# Patient Record
Sex: Female | Born: 1965 | Race: White | Hispanic: No | Marital: Married | Smoking: Never smoker
Health system: Southern US, Community
[De-identification: ages and names within clinical notes are randomized; demographics above are authoritative.]

## PROBLEM LIST (undated history)

## (undated) DIAGNOSIS — K219 Gastro-esophageal reflux disease without esophagitis: Secondary | ICD-10-CM

## (undated) DIAGNOSIS — Z800 Family history of malignant neoplasm of digestive organs: Secondary | ICD-10-CM

## (undated) DIAGNOSIS — K589 Irritable bowel syndrome without diarrhea: Secondary | ICD-10-CM

## (undated) DIAGNOSIS — Z803 Family history of malignant neoplasm of breast: Secondary | ICD-10-CM

## (undated) DIAGNOSIS — L732 Hidradenitis suppurativa: Secondary | ICD-10-CM

## (undated) DIAGNOSIS — D649 Anemia, unspecified: Secondary | ICD-10-CM

## (undated) DIAGNOSIS — J189 Pneumonia, unspecified organism: Secondary | ICD-10-CM

## (undated) DIAGNOSIS — M8700 Idiopathic aseptic necrosis of unspecified bone: Secondary | ICD-10-CM

## (undated) DIAGNOSIS — F419 Anxiety disorder, unspecified: Secondary | ICD-10-CM

## (undated) HISTORY — PX: CORE DECOMPRESSION, HIP: SHX858

## (undated) HISTORY — DX: Pneumonia, unspecified organism: J18.9

## (undated) HISTORY — DX: Family history of malignant neoplasm of digestive organs: Z80.0

## (undated) HISTORY — DX: Idiopathic aseptic necrosis of unspecified bone: M87.00

## (undated) HISTORY — DX: Anxiety disorder, unspecified: F41.9

## (undated) HISTORY — PX: OTHER SURGICAL HISTORY: SHX169

## (undated) HISTORY — PX: EYE SURGERY: SHX253

## (undated) HISTORY — DX: Gastro-esophageal reflux disease without esophagitis: K21.9

## (undated) HISTORY — DX: Family history of malignant neoplasm of breast: Z80.3

## (undated) HISTORY — DX: Irritable bowel syndrome, unspecified: K58.9

## (undated) HISTORY — DX: Hidradenitis suppurativa: L73.2

## (undated) HISTORY — DX: Anemia, unspecified: D64.9

---
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
## 2007-12-01 DIAGNOSIS — J189 Pneumonia, unspecified organism: Secondary | ICD-10-CM

## 2007-12-01 HISTORY — DX: Pneumonia, unspecified organism: J18.9

## 2008-11-02 ENCOUNTER — Ambulatory Visit: Payer: Self-pay | Admitting: Gastroenterology

## 2008-11-02 DIAGNOSIS — K591 Functional diarrhea: Secondary | ICD-10-CM

## 2008-11-02 DIAGNOSIS — K5900 Constipation, unspecified: Secondary | ICD-10-CM | POA: Insufficient documentation

## 2008-11-02 DIAGNOSIS — R1011 Right upper quadrant pain: Secondary | ICD-10-CM

## 2008-11-03 LAB — CONVERTED CEMR LAB
AST: 13 units/L (ref 0–37)
Albumin: 3.3 g/dL — ABNORMAL LOW (ref 3.5–5.2)
Alkaline Phosphatase: 54 units/L (ref 39–117)
Calcium: 8.5 mg/dL (ref 8.4–10.5)
Chloride: 103 meq/L (ref 96–112)
Eosinophils Absolute: 0.1 10*3/uL (ref 0.0–0.7)
HCT: 35.5 % — ABNORMAL LOW (ref 36.0–46.0)
Monocytes Absolute: 0.4 10*3/uL (ref 0.1–1.0)
Monocytes Relative: 6.3 % (ref 3.0–12.0)
Neutrophils Relative %: 66.2 % (ref 43.0–77.0)
Platelets: 275 10*3/uL (ref 150–400)
Potassium: 3.5 meq/L (ref 3.5–5.1)
RDW: 11.7 % (ref 11.5–14.6)
Sodium: 137 meq/L (ref 135–145)
Total Protein: 6.8 g/dL (ref 6.0–8.3)
WBC: 5.9 10*3/uL (ref 4.5–10.5)

## 2008-11-11 ENCOUNTER — Encounter (INDEPENDENT_AMBULATORY_CARE_PROVIDER_SITE_OTHER): Payer: Self-pay

## 2008-11-13 ENCOUNTER — Ambulatory Visit (HOSPITAL_COMMUNITY): Admission: RE | Admit: 2008-11-13 | Discharge: 2008-11-13 | Payer: Self-pay | Admitting: Gastroenterology

## 2008-11-13 DIAGNOSIS — K8020 Calculus of gallbladder without cholecystitis without obstruction: Secondary | ICD-10-CM | POA: Insufficient documentation

## 2008-11-20 ENCOUNTER — Ambulatory Visit: Payer: Self-pay | Admitting: Gastroenterology

## 2008-11-20 LAB — CONVERTED CEMR LAB
OCCULT 1: NEGATIVE
OCCULT 2: NEGATIVE
OCCULT 3: NEGATIVE
OCCULT 4: NEGATIVE
OCCULT 5: NEGATIVE

## 2008-11-25 ENCOUNTER — Encounter: Payer: Self-pay | Admitting: Gastroenterology

## 2008-11-27 ENCOUNTER — Ambulatory Visit: Payer: Self-pay | Admitting: Gastroenterology

## 2008-11-30 HISTORY — PX: CHOLECYSTECTOMY: SHX55

## 2008-12-16 ENCOUNTER — Ambulatory Visit (HOSPITAL_COMMUNITY): Admission: RE | Admit: 2008-12-16 | Discharge: 2008-12-17 | Payer: Self-pay | Admitting: General Surgery

## 2008-12-16 ENCOUNTER — Encounter (INDEPENDENT_AMBULATORY_CARE_PROVIDER_SITE_OTHER): Payer: Self-pay | Admitting: General Surgery

## 2009-01-13 ENCOUNTER — Encounter: Payer: Self-pay | Admitting: Gastroenterology

## 2009-01-25 ENCOUNTER — Ambulatory Visit: Payer: Self-pay | Admitting: Gastroenterology

## 2009-05-28 ENCOUNTER — Telehealth: Payer: Self-pay | Admitting: Gastroenterology

## 2009-06-02 ENCOUNTER — Ambulatory Visit: Payer: Self-pay | Admitting: Gastroenterology

## 2009-06-02 DIAGNOSIS — K219 Gastro-esophageal reflux disease without esophagitis: Secondary | ICD-10-CM

## 2009-06-02 DIAGNOSIS — K589 Irritable bowel syndrome without diarrhea: Secondary | ICD-10-CM

## 2010-05-30 ENCOUNTER — Encounter (INDEPENDENT_AMBULATORY_CARE_PROVIDER_SITE_OTHER): Payer: Self-pay | Admitting: *Deleted

## 2010-11-01 NOTE — Letter (Signed)
Summary: Office Visit Letter  [facility]  [address] [place], [place] [address]   Phone: [phone]  Fax: [phone]      May 30, 2010 MRN: [id number]   [facility] [address] [place], [place]  [address]   Dear Ms. Manley,   According to our records, it is time for you to schedule a follow-up office visit with [place].   At your convenience, please call [phone] (option #2)to schedule an office visit. If you have any questions, concerns, or feel that this letter is in error, we would appreciate your call.   Sincerely,  Judie Petit T. Russella Dar, M.D.  [facility] [phone]

## 2010-12-12 ENCOUNTER — Telehealth: Payer: Self-pay | Admitting: Gastroenterology

## 2010-12-20 NOTE — Progress Notes (Signed)
Summary: REfill   Phone Note Call from Patient Call back at [phone]   Call For: Dr Russella Dar Summary of Call: [facility] told her we denied her refill because she needed an appt. She scheduled first available on 01-02-11. Would really like a refill at least to  hold her over until her appoinment day.  Initial call taken by: Leanor Kail [facility],  December 12, 2010 4:46 PM  Follow-up for Phone Call        Rx was sent to pts pharmacy. Pt notifed to keep appt for any further refills. Follow-up by: Christie Nottingham CMA Duncan Dull),  December 13, 2010 10:11 AM    New/Updated Medications: DICYCLOMINE HCL 10 MG CAPS (DICYCLOMINE HCL) Take 1 tab three times a day as needed Prescriptions: DICYCLOMINE HCL 10 MG CAPS (DICYCLOMINE HCL) Take 1 tab three times a day as needed  #90 x 0   Entered by:   Christie Nottingham CMA (AAMA)   Authorized by:   Meryl Dare MD [facility]   Signed by:   Christie Nottingham CMA (AAMA) on 12/13/2010   Method used:   Electronically to        [facility]. # [id number]* (retail)       [address]       [place], [place]  [address]       Ph: [phone]       Fax: [phone]   RxID:   [id number]

## 2011-01-02 ENCOUNTER — Ambulatory Visit (INDEPENDENT_AMBULATORY_CARE_PROVIDER_SITE_OTHER): Payer: PRIVATE HEALTH INSURANCE | Admitting: Gastroenterology

## 2011-01-02 ENCOUNTER — Encounter: Payer: Self-pay | Admitting: Gastroenterology

## 2011-01-02 DIAGNOSIS — K589 Irritable bowel syndrome without diarrhea: Secondary | ICD-10-CM

## 2011-01-02 DIAGNOSIS — K219 Gastro-esophageal reflux disease without esophagitis: Secondary | ICD-10-CM

## 2011-01-02 MED ORDER — DICYCLOMINE HCL 10 MG PO CAPS: 10.0000 mg | ORAL_CAPSULE | Freq: Three times a day (TID) | ORAL | Status: DC | PRN

## 2011-01-02 NOTE — Patient Instructions (Signed)
Your prescriptions have been sent to your pharmacy 

## 2011-01-02 NOTE — Progress Notes (Addendum)
History of Present Illness: This is a 45 year old female who returns for followup of irritable bowel syndrome and GERD. She underwent colonoscopy in March 2009 and that was negative. 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 Her reflux symptoms are under very good control on Zegerid and antireflux measures.   Current Medications, Allergies, Past Medical History, Past Surgical History, Family History and Social History were reviewed in [employer] record.  Physical Exam: General: Well developed , well nourished, no acute distress Head: Normocephalic and atraumatic Eyes:  sclerae anicteric, EOMI Ears: Normal auditory acuity Mouth: No deformity or lesions Lungs: Clear throughout to auscultation Heart: Regular rate and rhythm; no murmurs, rubs or bruits Abdomen: Soft, non tender and non distended. No masses, hepatosplenomegaly or hernias noted. Normal Bowel sounds Musculoskeletal: Symmetrical with no gross deformities  Pulses:  Normal pulses noted Extremities: No clubbing, cyanosis, edema or deformities noted Neurological: Alert oriented x 4, grossly nonfocal Psychological:  Alert and cooperative. Normal mood and affect  Assessment and Recommendations:

## 2011-01-02 NOTE — Assessment & Plan Note (Signed)
Irritable bowel syndrome under very good control on dicyclomine and probiotics. Continue current regimen.

## 2011-01-02 NOTE — Assessment & Plan Note (Signed)
Reflux symptoms under very good control on Zegerid OTC anti-reflex measures. Continue current management.

## 2011-01-12 LAB — COMPREHENSIVE METABOLIC PANEL
ALT: 12 U/L (ref 0–35)
AST: 13 U/L (ref 0–37)
Albumin: 3.4 g/dL — ABNORMAL LOW (ref 3.5–5.2)
Calcium: 8.8 mg/dL (ref 8.4–10.5)
GFR calc Af Amer: >60 mL/min (ref >60)
Glucose, Bld: 87 mg/dL (ref 70–99)
Sodium: 137 mEq/L (ref 135–145)
Total Protein: 6.4 g/dL (ref 6.0–8.3)

## 2011-01-12 LAB — CBC
MCHC: 34.8 g/dL (ref 30.0–36.0)
Platelets: 272 10*3/uL (ref 150–400)
RDW: 12.2 % (ref 11.5–15.5)

## 2011-01-12 LAB — DIFFERENTIAL
Eosinophils Absolute: 0 10*3/uL (ref 0.0–0.7)
Lymphs Abs: 1.5 10*3/uL (ref 0.7–4.0)
Monocytes Relative: 5 % (ref 3–12)
Neutrophils Relative %: 69 % (ref 43–77)

## 2011-02-14 NOTE — Op Note (Signed)
NAME:  Holly Burke, Holly Burke               ACCOUNT NO.:  [id number]   MEDICAL RECORD NO.:  [id number]          PATIENT TYPE:  OIB   LOCATION:  5118                         FACILITY:  [facility]   PHYSICIAN:  Lennie Muckle, MD      DATE OF BIRTH:  1966-02-10   DATE OF PROCEDURE:  12/16/2008  DATE OF DISCHARGE:                               OPERATIVE REPORT   PREOPERATIVE DIAGNOSIS:  Biliary colic.   POSTOPERATIVE DIAGNOSIS:  Biliary colic.   PROCEDURE:  Laparoscopic cholecystectomy and attempted cholangiogram.   SURGEON:  Lennie Muckle, MD   ASSISTANT:  Kelle Darting. Rennis Harding, NP   FINDINGS:  Small stones within the gallbladder, small cystic duct.   SPECIMEN:  Gallbladder.   ESTIMATED BLOOD LOSS:  25 mL.   COMPLICATIONS:  No immediate complications.   DRAINS:  No drains were placed.   INDICATIONS FOR PROCEDURE:  Ms. Motter is a 45 year old female who has  had abdominal pain for greater than 7 years.  This was intermittent in  nature.  She was having irritable bowel syndrome several years ago.  She  continued to have small episodes of abdominal pain despite being on  treatment for her irritable bowel syndrome and ultrasound did reveal  multiple stones within the gallbladder.  No wall thickening and no  elevation of liver enzymes.  Informed consent was obtained for  laparoscopic cholecystectomy with cholangiogram.  Risks of the procedure  were explained to the patient prior.   DETAILS OF PROCEDURE:  Ms. Morais was identified in the preoperative  holding area.  She received 2 g of cefoxitin and was taken to the  operating room.  Once in the operating room, placed in supine position.  After administration of general endotracheal anesthesia, her abdomen was  prepped and draped in the usual sterile fashion.  A timeout procedure  indicating the patient and procedure was performed.  I placed an  incision at the umbilicus.  I grasped the fascia with Kocher and used  the Optiview to place an 11 mm  trocar into the abdominal cavity.  All  layers of the abdominal wall were visualized upon entry.  After  insufflation of the abdomen, I inspected the area.  There was no  evidence of injury upon placement of the trocar.  The patient was then  placed in right side up reversed Trendelenburg position.  Three trocars  were placed in the right upper quadrant with visualization with the  camera.  Fundus of the gallbladder was retracted superiorly.  The  gallbladder appeared normal in appearance.  I grasped the infundibulum  and away from the liver bed using a hook electrocautery and the Marilyn  forceps.  I dissected out the cystic duct, the cystic artery was just  medially and posterior to the cystic duct.  There was a small tear in  the infundibulum of the gallbladder.  I attempted to clip this closed.  I then placed a clip proximally on the cystic duct after obtaining  critical view.  I then carefully partially transected the cystic duct.  However, I was unable to place the  cholangiogram catheter and the cystic  duct, remaining posterior wall tore during trying to place the  cholangiogram catheter.  I therefore aborted the cholangiogram and was  able to grasp the cystic duct with Marilyn forceps and place three clips  proximally on the cystic duct.  I clipped and divided the cystic artery.  I then continued dissecting out the gallbladder using hook  electrocautery on the peritoneum.  There was no bile spillage during the  procedure.  The gallbladder was removed and placed in the EndoCatch bag.  Some of stones were retrieved out of the abdomen and irrigated with 2 L  of saline.  A small amount of oozing from the liver bed was controlled  with electrocautery.  I inspected the liver bed after final irrigation.  I found no bleeding but did elect to place Surgicel within the liver  bed.  This was placed without difficulty. Clips were intact on the  cystic duct artery.  I then closed the fascial  defect at the umbilicus  using a 0 Vicryl suture. There was no palpable defect after closure of  this.  Final inspection of the abdomen revealed no bleeding, no  abdominal injuries, clear fluid for irrigation.  I then released the  pneumoperitoneum and released the trocars.  Closed the skin with 4-0  Monocryl and Dermabond for final dressing.  The patient was extubated  and transported to postanesthesia care unit in stable condition.  She  will be discharged to home later today if she is  tolerating liquids.  She will be given Percocet for pain.      Lennie Muckle, MD  Electronically Signed     ALA/MEDQ  D:  12/16/2008  T:  12/17/2008  Job:  [id number]   cc:   Venita Lick. Russella Dar, MD, [facility]  Dr Corky Downs

## 2011-02-14 NOTE — Discharge Summary (Signed)
NAME:  KAMBREA, CARRASCO               ACCOUNT NO.:  [id number]   MEDICAL RECORD NO.:  [id number]          PATIENT TYPE:  OIB   LOCATION:  5118                         FACILITY:  [facility]   PHYSICIAN:  Lennie Muckle, MD      DATE OF BIRTH:  21-Aug-1966   DATE OF ADMISSION:  12/16/2008  DATE OF DISCHARGE:  12/17/2008                               DISCHARGE SUMMARY   FINAL DIAGNOSIS:  Cholelithiasis.   PROCEDURE:  Laparoscopic cholecystectomy on December 16, 2008.   HOSPITAL COURSE:  Ms. Tench was monitored overnight following  laparoscopic cholecystectomy.  She had had some mild nausea and did have  emesis while drinking ginger ale immediately following the procedure.  Receiving IV fluids and IV antiemetic, she was able to recover overnight  with that incident.  She is without nausea this morning, has some mild  complaints of incisional pain, otherwise, is mobilizing without  difficulty.  Incisions are without evidence of infection.  She will be  discharged home with Percocet for pain, instructed to take ibuprofen 600  mg every 6 hours p.r.n.  She will follow up with me in approximately 2  or 3 weeks.  She can shower and then after 10 days apply Vaseline with  Dermabond to gently remove this.      Lennie Muckle, MD  Electronically Signed     ALA/MEDQ  D:  12/17/2008  T:  12/18/2008  Job:  [id number]

## 2012-01-17 ENCOUNTER — Other Ambulatory Visit: Payer: Self-pay | Admitting: Gastroenterology

## 2012-01-17 NOTE — Telephone Encounter (Signed)
NEEDS OFFICE VISIT FOR ANY FURTHER REFILLS! 

## 2012-02-14 ENCOUNTER — Ambulatory Visit (INDEPENDENT_AMBULATORY_CARE_PROVIDER_SITE_OTHER): Payer: BC Managed Care – PPO | Admitting: Gastroenterology

## 2012-02-14 ENCOUNTER — Encounter: Payer: Self-pay | Admitting: Gastroenterology

## 2012-02-14 VITALS — BP 120/70 | HR 78 | Ht 65.0 in | Wt 221.4 lb

## 2012-02-14 DIAGNOSIS — K219 Gastro-esophageal reflux disease without esophagitis: Secondary | ICD-10-CM

## 2012-02-14 DIAGNOSIS — K589 Irritable bowel syndrome without diarrhea: Secondary | ICD-10-CM

## 2012-02-14 MED ORDER — DICYCLOMINE HCL 10 MG PO CAPS: ORAL_CAPSULE | ORAL | Status: AC

## 2012-02-14 NOTE — Progress Notes (Signed)
History of Present Illness: This is a 46 year old female with history of GERD and irritable bowel syndrome. Her reflux symptoms are well controlled on over-the-counter Zegerid. She takes dicyclomine 10-20 mg up to 3 times daily for active irritable bowel syndrome symptoms. She states she is under substantially more stress at work and her irritable bowel syndrome has been more active. She takes a daily probiotic which helps control her symptoms.  Current Medications, Allergies, Past Medical History, Past Surgical History, Family History and Social History were reviewed in [employer] record.  Physical Exam: General: Well developed , well nourished, no acute distress Head: Normocephalic and atraumatic Eyes:  sclerae anicteric, EOMI Ears: Normal auditory acuity Mouth: No deformity or lesions Lungs: Clear throughout to auscultation Heart: Regular rate and rhythm; no murmurs, rubs or bruits Abdomen: Soft, non tender and non distended. No masses, hepatosplenomegaly or hernias noted. Normal Bowel sounds Musculoskeletal: Symmetrical with no gross deformities  Pulses:  Normal pulses noted Extremities: No clubbing, cyanosis, edema or deformities noted Neurological: Alert oriented x 4, grossly nonfocal Psychological:  Alert and cooperative. Normal mood and affect  Assessment and Recommendations:  1. IBS. Continue Bentyl 10-20 mg 3 times a day as needed. Continue daily probiotic.  2. GERD. Continue Zegerid OTC daily and standard antireflux measures.

## 2012-02-14 NOTE — Patient Instructions (Signed)
We have sent the following medications to your pharmacy for you to pick up at your convenience:Dicyclomine. We have given your a work excuse to stay out the rest of the week.

## 2015-04-29 ENCOUNTER — Encounter: Payer: Self-pay | Admitting: Gastroenterology

## 2018-02-03 ENCOUNTER — Encounter: Payer: Self-pay | Admitting: Gastroenterology

## 2019-08-14 ENCOUNTER — Other Ambulatory Visit: Payer: Self-pay

## 2019-08-14 ENCOUNTER — Encounter: Payer: Self-pay | Admitting: Emergency Medicine

## 2019-08-14 ENCOUNTER — Emergency Department
Admission: EM | Admit: 2019-08-14 | Discharge: 2019-08-14 | Disposition: A | Discharge status: Home / Self Care | Payer: Managed Care, Other (non HMO) | Attending: Emergency Medicine | Admitting: Emergency Medicine

## 2019-08-14 ENCOUNTER — Emergency Department: Payer: Managed Care, Other (non HMO)

## 2019-08-14 DIAGNOSIS — Y9289 Other specified places as the place of occurrence of the external cause: Secondary | ICD-10-CM | POA: Insufficient documentation

## 2019-08-14 DIAGNOSIS — L03012 Cellulitis of left finger: Secondary | ICD-10-CM

## 2019-08-14 DIAGNOSIS — W5503XA Scratched by cat, initial encounter: Secondary | ICD-10-CM | POA: Diagnosis not present

## 2019-08-14 DIAGNOSIS — Y999 Unspecified external cause status: Secondary | ICD-10-CM | POA: Insufficient documentation

## 2019-08-14 DIAGNOSIS — Z79899 Other long term (current) drug therapy: Secondary | ICD-10-CM | POA: Insufficient documentation

## 2019-08-14 DIAGNOSIS — Y939 Activity, unspecified: Secondary | ICD-10-CM | POA: Diagnosis not present

## 2019-08-14 DIAGNOSIS — S60411A Abrasion of left index finger, initial encounter: Secondary | ICD-10-CM | POA: Diagnosis present

## 2019-08-14 DIAGNOSIS — S60512A Abrasion of left hand, initial encounter: Secondary | ICD-10-CM

## 2019-08-14 DIAGNOSIS — L0390 Cellulitis, unspecified: Secondary | ICD-10-CM

## 2019-08-14 LAB — COMPREHENSIVE METABOLIC PANEL
ALT: 19 U/L (ref 0–44)
AST: 18 U/L (ref 15–41)
Albumin: 3.9 g/dL (ref 3.5–5.0)
Alkaline Phosphatase: 103 U/L (ref 38–126)
Anion gap: 12 (ref 5–15)
BUN: 12 mg/dL (ref 6–20)
CO2: 28 mmol/L (ref 22–32)
Calcium: 9.2 mg/dL (ref 8.9–10.3)
Chloride: 98 mmol/L (ref 98–111)
Creatinine, Ser: 0.67 mg/dL (ref 0.44–1.00)
GFR calc Af Amer: >60 mL/min (ref >60)
GFR calc non Af Amer: >60 mL/min (ref >60)
Glucose, Bld: 105 mg/dL — ABNORMAL HIGH (ref 70–99)
Potassium: 3.7 mmol/L (ref 3.5–5.1)
Sodium: 138 mmol/L (ref 135–145)
Total Bilirubin: 0.6 mg/dL (ref 0.3–1.2)
Total Protein: 7.5 g/dL (ref 6.5–8.1)

## 2019-08-14 LAB — CBC
HCT: 37.5 % (ref 36.0–46.0)
Hemoglobin: 12.8 g/dL (ref 12.0–15.0)
MCH: 28.6 pg (ref 26.0–34.0)
MCHC: 34.1 g/dL (ref 30.0–36.0)
MCV: 83.9 fL (ref 80.0–100.0)
Platelets: 269 10*3/uL (ref 150–400)
RBC: 4.47 MIL/uL (ref 3.87–5.11)
RDW: 13.1 % (ref 11.5–15.5)
WBC: 9 10*3/uL (ref 4.0–10.5)
nRBC: 0 % (ref 0.0–0.2)

## 2019-08-14 MED ORDER — SODIUM CHLORIDE 0.9 % IV SOLN
1.0000 g | Freq: Once | INTRAVENOUS | Status: AC
  Administered 2019-08-14: 21:00:00 1 g via INTRAVENOUS
  Filled 2019-08-14: qty 10

## 2019-08-14 MED ORDER — KETOROLAC TROMETHAMINE 30 MG/ML IJ SOLN
30.0000 mg | Freq: Once | INTRAMUSCULAR | Status: AC
  Administered 2019-08-14: 30 mg via INTRAVENOUS
  Filled 2019-08-14: qty 1

## 2019-08-14 MED ORDER — CEPHALEXIN 500 MG PO CAPS: 500.0000 mg | ORAL_CAPSULE | Freq: Three times a day (TID) | ORAL | 0 refills | Status: AC

## 2019-08-14 NOTE — Discharge Instructions (Signed)
Take the Keflex 3 times daily and Bactrim 2 times daily, as directed. Wear the finger splint for protection. Follow-up with [facility] for ongoing symptoms. Return immediately for signs of worsening infection.

## 2019-08-14 NOTE — ED Provider Notes (Signed)
[facility] Emergency Department Provider Note ____________________________________________  Time seen: 1826  I have reviewed the triage vital signs and the nursing notes.  HISTORY  Chief Complaint  Animal Bite  HPI Holly Burke is a 53 y.o. female presents himself to the ED for evaluation of progressive infection to the left index finger.  Patient sustained a scratch to the left index finger by her cat to the dorsal and volar aspect of the PIP.  She initially presented to a local urgent care and was given an IM injection of Rocephin, and given a prescription for Bactrim.  She was encouraged to report to the ED immediately for signs of worsening infection.  Patient presented today because she experienced increased redness, swelling, and stiffness to the finger at the joint and proximally into the palm.  She denies any interim fevers, but notes generalized achy pain to the left index finger.  She denies any other injury but reported periodic spontaneous drainage.   Past Medical History:  Diagnosis Date  . GERD (gastroesophageal reflux disease)   . IBS (irritable bowel syndrome)   . Pneumonia 12/2007    Patient Active Problem List   Diagnosis Date Noted  . GERD 06/02/2009  . IRRITABLE BOWEL SYNDROME 06/02/2009  . CHOLELITHIASIS, SYMPTOMATIC 11/13/2008  . CONSTIPATION 11/02/2008  . FUNCTIONAL DIARRHEA 11/02/2008  . ABDOMINAL PAIN RIGHT UPPER QUADRANT 11/02/2008    Past Surgical History:  Procedure Laterality Date  . CHOLECYSTECTOMY  11/2008  . EYE SURGERY  1980's   bilateral    Prior to Admission medications   Medication Sig Start Date End Date Taking? Authorizing Provider  oxyCODONE-acetaminophen (PERCOCET/ROXICET) 5-325 MG tablet Take 1 tablet by mouth every 4 (four) hours as needed for severe pain.   Yes [provider]  B Complex-C-E-Zn (B COMPLEX-C-E-ZINC) tablet Take 1 tablet by mouth daily.      [provider]  cephALEXin  (KEFLEX) 500 MG capsule Take 1 capsule (500 mg total) by mouth 3 (three) times daily for 7 days. 08/14/19 08/21/19  Prajwal Fellner, Charlesetta IvoryJenise V Bacon, PA-C  dicyclomine (BENTYL) 10 MG capsule 1-2 tablets by mouth three times a day 02/14/12   Meryl DareStark, Malcolm T, MD  drospirenone-ethinyl estradiol (YAZ) 3-0.02 MG per tablet Take 1 tablet by mouth daily.      [provider]  FLUoxetine (PROZAC) 10 MG capsule Take 10 mg by mouth daily.    [provider]  Ibuprofen (ADVIL MIGRAINE PO) Take by mouth as needed.    [provider]  Omeprazole-Sodium Bicarbonate (ZEGERID OTC PO) Take 1 capsule by mouth daily.      [provider]  Probiotic Product (PROBIOTIC PO) Take by mouth. One a day or two a day     [provider]  rOPINIRole (REQUIP) 0.25 MG tablet Take 0.25 mg by mouth 2 (two) times daily.    [provider]  Saccharomyces boulardii (FLORASTOR PO) Take by mouth as needed. Will take one or two a day.    [provider]    Allergies Patient has no known allergies.  Family History  Problem Relation Age of Onset  . Breast cancer Maternal Grandmother   . Colon cancer Neg Hx     Social History Social History   Tobacco Use  . Smoking status: Never Smoker  . Smokeless tobacco: Never Used  Substance Use Topics  . Alcohol use: No  . Drug use: No    Review of Systems  Constitutional: Negative for fever. Cardiovascular:  Negative for chest pain. Respiratory: Negative for shortness of breath. Gastrointestinal: Negative for abdominal pain, vomiting and diarrhea. Genitourinary: Negative for dysuria. Musculoskeletal: Negative for back pain. Skin: Negative for rash.  Left index finger infection as above. Neurological: Negative for headaches, focal weakness or numbness. ____________________________________________  PHYSICAL EXAM:  VITAL SIGNS: ED Triage Vitals  Enc Vitals Group     BP 08/14/19 1734 (!) 143/90     Pulse Rate 08/14/19  1734 87     Resp 08/14/19 1734 18     Temp 08/14/19 1734 98.7 F (37.1 C)     Temp Source 08/14/19 1734 Oral     SpO2 08/14/19 1734 100 %     Weight 08/14/19 1731 235 lb (106.6 kg)     Height 08/14/19 1731 5\' 5"  (1.651 m)     Head Circumference --      Peak Flow --      Pain Score 08/14/19 1731 9     Pain Loc --      Pain Edu? --      Excl. in [place]? --     Constitutional: Alert and oriented. Well appearing and in no distress. Head: Normocephalic and atraumatic. Eyes: Conjunctivae are normal. Normal extraocular movements Cardiovascular: Normal rate, regular rhythm. Normal distal pulses. Respiratory: Normal respiratory effort. No wheezes/rales/rhonchi. Gastrointestinal: Soft and nontender.  Musculoskeletal: Left hand with obvious soft tissue swelling about the dorsal and palmar aspect of the left index finger.  Patient well-demarcated erythema extending to the dorsum of the hand proximal to the MCP.  She is reporting pain with active flexion extension range to the left index finger which is limited by soft tissue swelling.  There is a early scab noted to the dorsal and volar MCP consistent with a recent scratch injury.  Nontender with normal range of motion in all extremities.  Neurologic:  Normal gross sensation. Normal speech and language. No gross focal neurologic deficits are appreciated. Skin:  Skin is warm, dry and intact. No rash noted. ____________________________________________   LABS (pertinent positives/negatives)  Labs Reviewed  COMPREHENSIVE METABOLIC PANEL - Abnormal; Notable for the following components:      Result Value   Glucose, Bld 105 (*)    All other components within normal limits  CBC  ____________________________________________   RADIOLOGY  DG Left Index Finger  IMPRESSION: Soft tissue swelling without evidence for an acute osseous Abnormality  I, Swayzee Wadley V Bacon-Naimah Yingst, personally viewed and evaluated these images (plain radiographs) as part of my  medical decision making, as well as reviewing the written report by the radiologist. ____________________________________________  PROCEDURES  Rocephin 1 g IVP Toradol 30 mg IVP Finger splint Procedures ____________________________________________  INITIAL IMPRESSION / ASSESSMENT AND PLAN / ED COURSE  Patient with ED evaluation of a left finger cellulitis secondary to a cat scratch injury.  X-rays are reassuring and labs do not show any signs of sepsis or leukocytosis.  Patient is treated with an IV dose of Rocephin in the ED.  She will be discharged with a prescription for cephalexin to take in addition to the previously prescribed Bactrim.  She is encouraged to keep the wound clean, dry, and covered.  She will follow-up with [facility] for routine care.  She is encouraged to return to ED immediately for signs of worsening infection as discussed.  Patient verbalizes understanding of her discharge instructions.  Holly Burke was evaluated in Emergency Department on 08/14/2019 for the symptoms described in the history of present illness. She was evaluated  in the context of the global COVID-19 pandemic, which necessitated consideration that the patient might be at risk for infection with the SARS-CoV-2 virus that causes COVID-19. Institutional protocols and algorithms that pertain to the evaluation of patients at risk for COVID-19 are in a state of rapid change based on information released by regulatory bodies including the CDC and federal and state organizations. These policies and algorithms were followed during the patient's care in the ED. ____________________________________________  FINAL CLINICAL IMPRESSION(S) / ED DIAGNOSES  Final diagnoses:  Cat scratch of hand, left, initial encounter  Cellulitis of finger of left hand      Karmen Stabs, Charlesetta Ivory, PA-C 08/14/19 2104    Arnaldo Natal, MD 08/14/19 2122

## 2019-08-14 NOTE — ED Triage Notes (Signed)
Pt reports that her cat scratched her right hand index finger and it is swelling. Pt reports went to [facility] and was given a rocephin shot and has been using her oxycodone from her surgery. Pt reports her cats immunizations are up to date.

## 2019-08-14 NOTE — ED Notes (Signed)
See triage note  Presents with infection to left index finger  States she was scratched by her cat yesterday

## 2020-01-18 NOTE — Progress Notes (Signed)
PCP: Valera Castle, MD   Chief Complaint  Patient presents with  . Gynecologic Exam  . [employer] Employee    HPI:      Ms. Holly Burke is a 54 y.o. No obstetric history on file. who LMP was Patient's last menstrual period was 01/26/2012., presents today for her NP > 3 yrs  annual examination.  Her menses are absent due to menopause about 2 yrs ago. She does not have intermenstrual bleeding. She does have vasomotor sx. Would like to start HRT. Hx of avascular necrosis and wonders if will help with bones overall. 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    Sex activity: not sexually active. She does not have vaginal dryness.  Last Pap: not recent; no hx of abn paps with bx/tx.  Last mammogram: 06/02/19 at [facility]; Results were: normal--routine follow-up in 12 months There is a FH of breast cancer in her MGM and pancreatic cancer in her sister. Genetic testing not done. There is no FH of ovarian cancer. The patient does do self-breast exams.  Colonoscopy: 2018 at [facility] with colon polyp; Repeat due after 5 years.   DEXA: 2020 at [facility]. Ok per pt.  Tobacco use: The patient denies current or previous tobacco use. Alcohol use: none  No drug use Exercise: moderately active  She does get adequate calcium but not Vitamin D in her diet.  Labs with PCP.   Past Medical History:  Diagnosis Date  . Anemia   . Anxiety   . Avascular necrosis ([place])   . GERD (gastroesophageal reflux disease)   . Hidradenitis suppurativa   . IBS (irritable bowel syndrome)   . Pneumonia 12/2007    Past Surgical History:  Procedure Laterality Date  . CHOLECYSTECTOMY  11/2008  . CORE DECOMPRESSION, HIP    . EYE SURGERY  1980's   bilateral    Family History  Problem Relation Age of Onset  . Breast cancer Maternal Grandmother        30s, died from Brain mets in 28s  . Hypertension Father   . Pancreatic cancer Sister 24  . Colon cancer Neg Hx      Social History   Socioeconomic History  . Marital status: Married    Spouse name: Not on file  . Number of children: 0  . Years of education: Not on file  . Highest education level: Not on file  Occupational History  . Occupation: Geologist, engineering Rep    Employer: [employer]  Tobacco Use  . Smoking status: Never Smoker  . Smokeless tobacco: Never Used  Substance and Sexual Activity  . Alcohol use: No  . Drug use: No  . Sexual activity: Not Currently    Birth control/protection: Post-menopausal  Other Topics Concern  . Not on file  Social History Narrative  . Not on file   Social Determinants of Health   Financial Resource Strain:   . Difficulty of Paying Living Expenses:   Food Insecurity:   . Worried About Charity fundraiser in the Last Year:   . Arboriculturist in the Last Year:   Transportation Needs:   . Film/video editor (Medical):   Marland Kitchen Lack of Transportation (Non-Medical):   Physical Activity:   . Days of Exercise per Week:   . Minutes of Exercise per Session:   Stress:   . Feeling of Stress :   Social Connections:   .  Frequency of Communication with Friends and Family:   . Frequency of Social Gatherings with Friends and Family:   . Attends Religious Services:   . Active Member of Clubs or Organizations:   . Attends Archivist Meetings:   Marland Kitchen Marital Status:   Intimate Partner Violence:   . Fear of Current or Ex-Partner:   . Emotionally Abused:   Marland Kitchen Physically Abused:   . Sexually Abused:      Current Outpatient Medications:  .  albuterol (VENTOLIN HFA) 108 (90 Base) MCG/ACT inhaler, Inhale into the lungs., Disp: , Rfl:  .  aspirin 81 MG EC tablet, Adult Low Dose Aspirin 81 mg tablet,delayed release, Disp: , Rfl:  .  Aspirin-Acetaminophen-Caffeine (EXCEDRIN EXTRA STRENGTH PO), Excedrin Migraine  take 1 prn, Disp: , Rfl:  .  celecoxib (CELEBREX) 200 MG capsule, Celebrex 200 mg capsule  Take 1 capsule every day  by oral route for 30 days., Disp: , Rfl:  .  clindamycin-benzoyl peroxide (BENZACLIN) gel, clindamycin 1 %-benzoyl peroxide 5 % topical gel with pump, Disp: , Rfl:  .  cyclobenzaprine (FLEXERIL) 10 MG tablet, cyclobenzaprine 10 mg tablet  1 PO bid, Disp: , Rfl:  .  diclofenac (VOLTAREN) 75 MG EC tablet, diclofenac sodium 75 mg tablet,delayed release, Disp: , Rfl:  .  dicyclomine (BENTYL) 10 MG capsule, 1-2 tablets by mouth three times a day, Disp: 100 capsule, Rfl: 11 .  Omeprazole-Sodium Bicarbonate (ZEGERID OTC PO), Take 1 capsule by mouth daily.  , Disp: , Rfl:  .  rOPINIRole (REQUIP) 1 MG tablet, ropinirole 1 mg tablet, Disp: , Rfl:  .  diclofenac Sodium (VOLTAREN) 1 % GEL, Voltaren 1 % topical gel, Disp: , Rfl:  .  Estradiol-Norethindrone Acet (ACTIVELLA) 0.5-0.1 MG tablet, Take 1 tablet by mouth daily., Disp: 90 tablet, Rfl: 3     ROS:  Review of Systems  Constitutional: Negative for fatigue, fever and unexpected weight change.  Respiratory: Negative for cough, shortness of breath and wheezing.   Cardiovascular: Negative for chest pain, palpitations and leg swelling.  Gastrointestinal: Positive for constipation and diarrhea. Negative for blood in stool, nausea and vomiting.  Endocrine: Negative for cold intolerance, heat intolerance and polyuria.  Genitourinary: Negative for dyspareunia, dysuria, flank pain, frequency, genital sores, hematuria, menstrual problem, pelvic pain, urgency, vaginal bleeding, vaginal discharge and vaginal pain.  Musculoskeletal: Positive for arthralgias and joint swelling. Negative for back pain and myalgias.  Skin: Negative for rash.  Neurological: Positive for headaches. Negative for dizziness, syncope, light-headedness and numbness.  Hematological: Negative for adenopathy.  Psychiatric/Behavioral: Positive for agitation. Negative for confusion, sleep disturbance and suicidal ideas. The patient is not nervous/anxious.    BREAST: No  symptoms    Objective: BP 130/90   Ht '5\' 5"'$  (1.651 m)   Wt 232 lb (105.2 kg)   LMP 01/26/2012   BMI 38.61 kg/m    Physical Exam Constitutional:      Appearance: She is well-developed.  Genitourinary:     Vulva, vagina, cervix, uterus, right adnexa and left adnexa normal.     No vulval lesion or tenderness noted.     No vaginal discharge, erythema or tenderness.     No cervical polyp.     Uterus is not enlarged or tender.     No right or left adnexal mass present.     Right adnexa not tender.     Left adnexa not tender.  Neck:     Thyroid: No thyromegaly.  Cardiovascular:  Rate and Rhythm: Normal rate and regular rhythm.     Heart sounds: Normal heart sounds. No murmur.  Pulmonary:     Effort: Pulmonary effort is normal.     Breath sounds: Normal breath sounds.  Chest:     Breasts:        Right: No mass, nipple discharge, skin change or tenderness.        Left: No mass, nipple discharge, skin change or tenderness.  Abdominal:     Palpations: Abdomen is soft.     Tenderness: There is no abdominal tenderness. There is no guarding.  Musculoskeletal:        General: Normal range of motion.     Cervical back: Normal range of motion.  Neurological:     General: No focal deficit present.     Mental Status: She is alert and oriented to person, place, and time.     Cranial Nerves: No cranial nerve deficit.  Skin:    General: Skin is warm and dry.  Psychiatric:        Mood and Affect: Mood normal.        Behavior: Behavior normal.        Thought Content: Thought content normal.        Judgment: Judgment normal.  Vitals reviewed.     Assessment/Plan:  Encounter for annual routine gynecological examination  Cervical cancer screening - Plan: IGP, Aptima HPV  Screening for HPV (human papillomavirus) - Plan: IGP, Aptima HPV  Encounter for screening mammogram for malignant neoplasm of breast - Plan: MM 3D SCREEN BREAST BILATERAL; [facility] sched [facility]  Family  history of breast cancer - Plan: Integrated BRACAnalysis ([place]); MyRisk testing discussed and done today. Will call with results.  Family history of pancreatic cancer  Hormone replacement therapy (HRT) - Plan: Estradiol-Norethindrone Acet (ACTIVELLA) 0.5-0.1 MG tablet; Discussed HRT. Rx activella. F/u prn.   Vasomotor symptoms due to menopause - Plan: Estradiol-Norethindrone Acet (ACTIVELLA) 0.5-0.1 MG tablet   Meds ordered this encounter  Medications  . Estradiol-Norethindrone Acet (ACTIVELLA) 0.5-0.1 MG tablet    Sig: Take 1 tablet by mouth daily.    Dispense:  90 tablet    Refill:  3    Order Specific Question:   Supervising Provider    Answer:   Gae Dry [[id number]]            GYN counsel breast self exam, mammography screening, menopause, adequate intake of calcium and vitamin D, diet and exercise    F/U  Return in about 1 year (around 01/18/2021).  Holly Giacobbe B. Rosey Eide, PA-C 01/19/2020 10:46 AM

## 2020-01-19 ENCOUNTER — Encounter: Payer: Self-pay | Admitting: Obstetrics and Gynecology

## 2020-01-19 ENCOUNTER — Other Ambulatory Visit: Payer: Self-pay

## 2020-01-19 ENCOUNTER — Ambulatory Visit (INDEPENDENT_AMBULATORY_CARE_PROVIDER_SITE_OTHER): Payer: Managed Care, Other (non HMO) | Admitting: Obstetrics and Gynecology

## 2020-01-19 VITALS — BP 130/90 | Ht 65.0 in | Wt 232.0 lb

## 2020-01-19 DIAGNOSIS — Z800 Family history of malignant neoplasm of digestive organs: Secondary | ICD-10-CM

## 2020-01-19 DIAGNOSIS — Z1151 Encounter for screening for human papillomavirus (HPV): Secondary | ICD-10-CM

## 2020-01-19 DIAGNOSIS — Z124 Encounter for screening for malignant neoplasm of cervix: Secondary | ICD-10-CM

## 2020-01-19 DIAGNOSIS — Z01419 Encounter for gynecological examination (general) (routine) without abnormal findings: Secondary | ICD-10-CM

## 2020-01-19 DIAGNOSIS — Z79890 Hormone replacement therapy (postmenopausal): Secondary | ICD-10-CM

## 2020-01-19 DIAGNOSIS — Z1231 Encounter for screening mammogram for malignant neoplasm of breast: Secondary | ICD-10-CM | POA: Diagnosis not present

## 2020-01-19 DIAGNOSIS — N951 Menopausal and female climacteric states: Secondary | ICD-10-CM

## 2020-01-19 DIAGNOSIS — Z803 Family history of malignant neoplasm of breast: Secondary | ICD-10-CM | POA: Insufficient documentation

## 2020-01-19 MED ORDER — ESTRADIOL-NORETHINDRONE ACET 0.5-0.1 MG PO TABS: 1.0000 | ORAL_TABLET | Freq: Every day | ORAL | 3 refills | Status: DC

## 2020-01-19 NOTE — Patient Instructions (Signed)
I value your feedback and entrusting us with your care. If you get a [facility] patient survey, I would appreciate you taking the time to let us know about your experience today. Thank you!  As of September 11, 2019, your lab results will be released to your MyChart immediately, before I even have a chance to see them. Please give me time to review them and contact you if there are any abnormalities. Thank you for your patience.  

## 2020-01-21 LAB — IGP, APTIMA HPV: HPV Aptima: NEGATIVE

## 2020-02-04 ENCOUNTER — Encounter: Payer: Self-pay | Admitting: Obstetrics and Gynecology

## 2020-02-19 ENCOUNTER — Telehealth: Payer: Self-pay

## 2020-02-19 NOTE — Telephone Encounter (Signed)
Received fax from [facility] saying test for patient has been cancelled. She was referred to a genetic counselor due to her insurance requirement, but both [facility] and the genetic counselor haven't been able to get in touch with patient. Genetic counselor # to make appt [phone]. [facility] still has her blood specimen and it is good for 60 days from cancellation day which was yesterday (02/18/20). Tried to contact pt, had no luck, LVMTRC.

## 2020-02-19 NOTE — Telephone Encounter (Signed)
Patient calling to speak with Hoover Brunette, please advise

## 2020-02-19 NOTE — Telephone Encounter (Signed)
Spoke to pt and she is aware of the situation. Gave her Dentist #. Pt says she hasn't had time to get with a Dentist.

## 2020-03-02 DIAGNOSIS — Z1371 Encounter for nonprocreative screening for genetic disease carrier status: Secondary | ICD-10-CM

## 2020-03-02 HISTORY — DX: Encounter for nonprocreative screening for genetic disease carrier status: Z13.71

## 2020-03-16 ENCOUNTER — Encounter: Payer: Self-pay | Admitting: Obstetrics and Gynecology

## 2020-05-03 IMAGING — DX DG FINGER INDEX 2+V*L*
3 series · 3 of 3 positions shown · non-contrast
Comparison: None.

CLINICAL DATA: Pain status post cat scratch

EXAM:
LEFT INDEX FINGER 2+V

[finger ap]
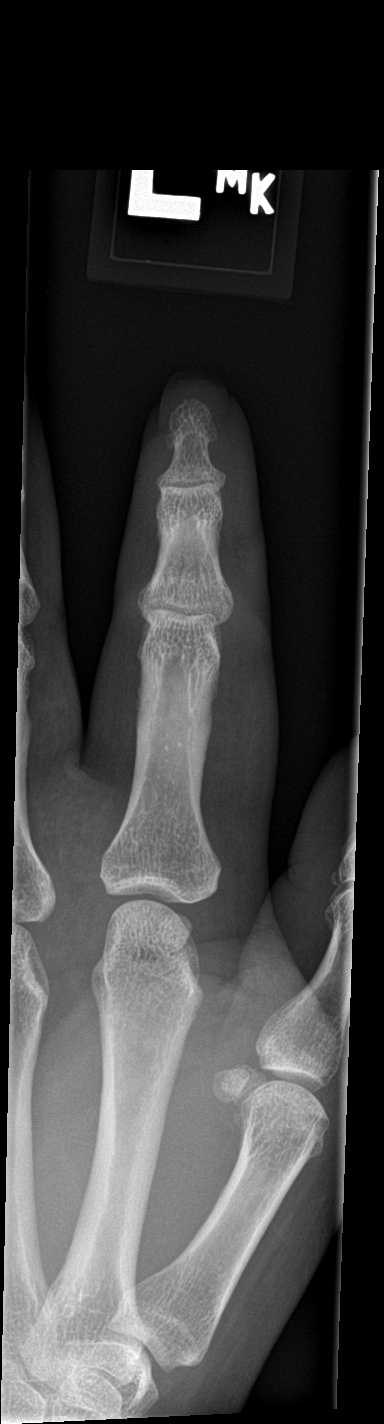

[finger obl]
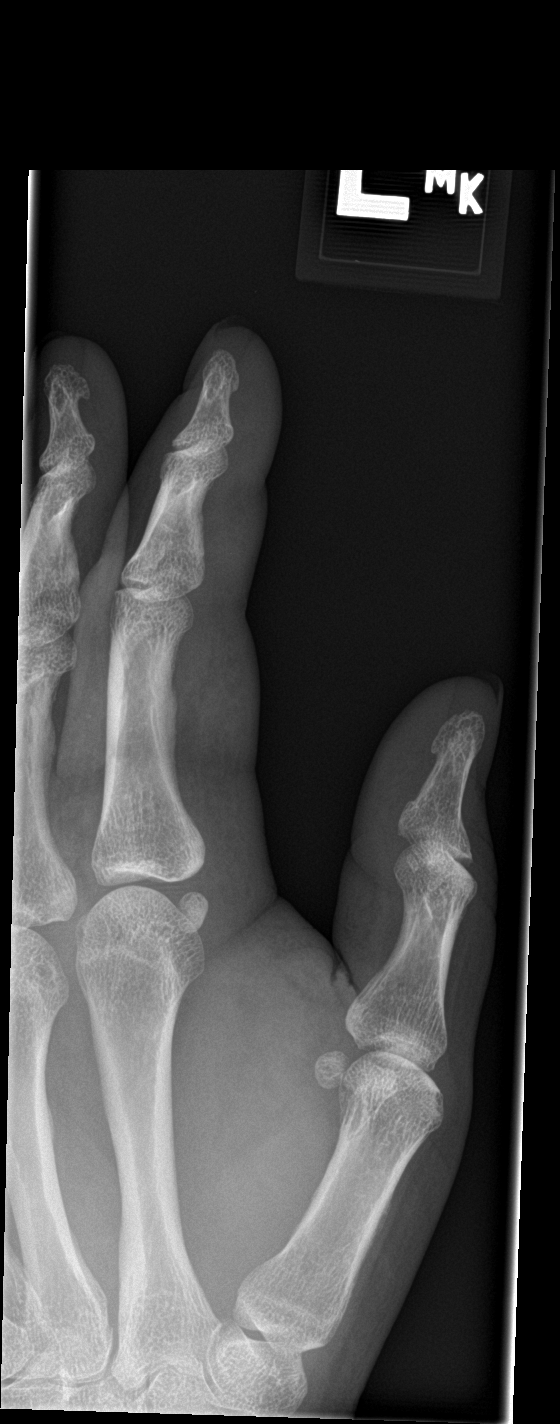

[finger lat]
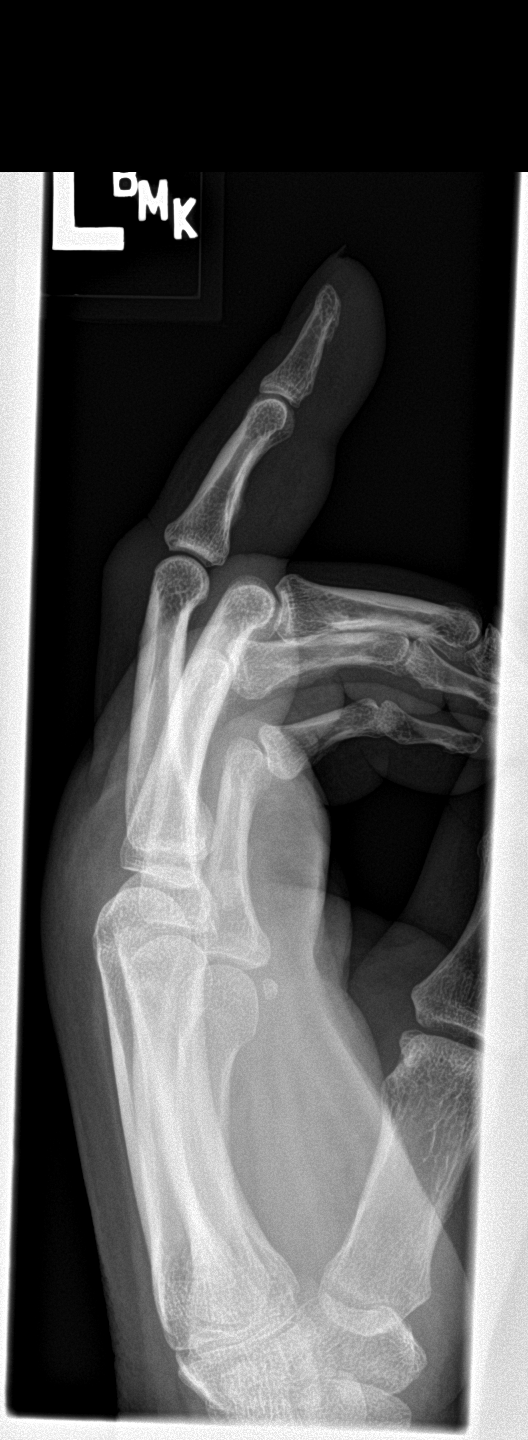

[3 of 3 positions shown; findings below may reference images not displayed]

FINDINGS: There is extensive soft tissue swelling about the second digit
without evidence for radiopaque foreign body. No displaced fracture
or dislocation. No radiographic evidence for osteomyelitis.
IMPRESSION: Soft tissue swelling without evidence for an acute osseous
abnormality.

## 2020-06-17 NOTE — Telephone Encounter (Signed)
Spoke with [facility]. She was notified of MyRisk genetic testing results 03/25/20 by them.

## 2020-09-20 ENCOUNTER — Encounter: Payer: Self-pay | Admitting: Obstetrics and Gynecology

## 2020-09-20 DIAGNOSIS — R879 Unspecified abnormal finding in specimens from female genital organs: Secondary | ICD-10-CM

## 2020-09-21 ENCOUNTER — Ambulatory Visit (INDEPENDENT_AMBULATORY_CARE_PROVIDER_SITE_OTHER): Payer: Managed Care, Other (non HMO)

## 2020-09-21 ENCOUNTER — Other Ambulatory Visit: Payer: Self-pay

## 2020-09-21 DIAGNOSIS — R879 Unspecified abnormal finding in specimens from female genital organs: Secondary | ICD-10-CM | POA: Diagnosis not present

## 2020-09-23 ENCOUNTER — Telehealth: Payer: Self-pay | Admitting: Obstetrics and Gynecology

## 2020-09-23 NOTE — Telephone Encounter (Signed)
Pt aware of neg GYN u/s results for adenomyosis, suspected on MRI. Pt denies PMB/no uterine problems. Reassurance. F/u prn.

## 2022-08-07 ENCOUNTER — Telehealth: Payer: Self-pay

## 2022-08-07 NOTE — Telephone Encounter (Signed)
Two CD syphilis reports received from [facility] Plasma: Specimen date = 07/17/22 Source= Plasma (specimen# [id number]) Test= STS Preliminary result, date 07/20/22 = Reactive  Specimen Date= 07/24/22 Source= Plasma (specimen# [id number]) Test = STS Confirmatory result, dated 07/31/22 = Non Reactive  (Specimen #s different)  No previous testing found in Epic, [facility], or [facility].

## 2022-08-07 NOTE — Telephone Encounter (Signed)
Phone note entered in error. Pt lives in [place].  Records faxed to [facility]. Also requested that records be faxed to [facility].

## 2023-04-05 NOTE — Progress Notes (Signed)
Holly Housekeeper, MD   Chief Complaint  Patient presents with   Referral    Pt had a 1 week cycle last yr in August, no abnormal pain.    HPI:      Ms. Holly Burke is a 57 y.o. G0P0000 whose LMP was Patient's last menstrual period was 01/26/2012., presents today for eval of PMB, ref by PCP. LMP age 88; no bleeding till 8/23. Lasted for a week, moderate flow like real period, no clots, mild dysmen. She does have vasomotor sx. Not sexually active prior to sx. No PMB since. Neg pap/neg HPV DNA 01/19/20. Pt is MyRisk neg due to FH breast/pancreatic ca.   GYN u/s done 12/21 for abnormal finding in uterus (possible adenomyosis) on hip MRI for AVN  09/20/20 GYN u/s: Findings:  The uterus is retroverted and measures 6.2 x 6.2 x 4.5 cm. Echo texture is heterogenous with evidence of focal masses. Within the uterus there is at least one possible degenerating fibroid. It measures 8.7 x 7.3 x 9.0 mm. It is on the left side of the uterus.  There is a second possible degenerating fibroid vs. A myometrial cyst measuring 7.8 x 5.0 x 9.9 mm.    There is a simple 5 mm cyst in the lower uterine myometrium.    The Endometrium measures 3.9 mm.   Right Ovary measures 2.0 x 1.7 x 1.2 cm. It is normal in appearance. Left Ovary measures 2.8 x 1.6 x 1.5 cm. It is normal in appearance. There is a follicle in the left ovary measuring 14.9 x 14.6 x 15.5 mm.  Survey of the adnexa demonstrates no adnexal masses. There is no free fluid in the cul de sac.   Impression: 1. Thin endometrium.  2. The uterus is heterogeneous with a few possible degenerating fibroids vs. Myometrial cysts.  3. Normal appearing cervix and ovaries.     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   ABDOMINAL PAIN RIGHT UPPER QUADRANT 11/02/2008   Past Medical History:  Diagnosis Date   Anemia    Anxiety    Avascular necrosis ([facility])    BRCA negative 03/2020   MyRisk neg, IBIS=18.1%/riskscore=12.5%   Family history of breast cancer    Family history of pancreatic cancer    GERD (gastroesophageal reflux disease)    Hidradenitis suppurativa    IBS (irritable bowel syndrome)    Pneumonia 12/2007     Past Surgical History:  Procedure Laterality Date   CHOLECYSTECTOMY  11/2008   CORE DECOMPRESSION, HIP     EYE SURGERY  1980's   bilateral   OTHER SURGICAL HISTORY     5 surgeries on left hand    Family History  Problem Relation Age of Onset   Hypertension Father    Pancreatic cancer Sister 51   Breast cancer Maternal Grandmother        3s, died from Brain mets in 39s   Colon cancer Neg Hx     Social History   Socioeconomic History   Marital status: Married    Spouse name: Not on file   Number of children: 0   Years of education:  Not on file   Highest education level: Not on file  Occupational History   Occupation: Training and development officer Rep    Employer: [employer]  Tobacco Use   Smoking status: Never   Smokeless tobacco: Never  Vaping Use   Vaping Use: Never used  Substance and Sexual Activity   Alcohol use: No   Drug use: No   Sexual activity: Not Currently    Birth control/protection: Post-menopausal  Other Topics Concern   Not on file  Social History Narrative   Not on file   Social Determinants of Health   Financial Resource Strain: Not on file  Food Insecurity: Not on file  Transportation Needs: Not on file  Physical Activity: Not on file  Stress: Not on file  Social Connections: Not on file  Intimate Partner Violence: Not on file    Outpatient Medications Prior to Visit  Medication Sig Dispense  Refill   albuterol (VENTOLIN HFA) 108 (90 Base) MCG/ACT inhaler Inhale into the lungs.     apixaban (ELIQUIS) 2.5 MG TABS tablet      ascorbic acid (VITAMIN C) 1000 MG tablet Take by mouth.     aspirin 81 MG EC tablet Adult Low Dose Aspirin 81 mg tablet,delayed release     Aspirin-Acetaminophen-Caffeine (EXCEDRIN EXTRA STRENGTH PO) Excedrin Migraine  take 1 prn     celecoxib (CELEBREX) 200 MG capsule Celebrex 200 mg capsule  Take 1 capsule every day by oral route for 30 days.     Cholecalciferol (VITAMIN D3) 10 MCG (400 UNIT) tablet      cyclobenzaprine (FLEXERIL) 10 MG tablet cyclobenzaprine 10 mg tablet  1 PO bid     dicyclomine (BENTYL) 10 MG capsule 1-2 tablets by mouth three times a day 100 capsule 11   esomeprazole (NEXIUM) 40 MG capsule Take 40 mg by mouth daily.     ezetimibe (ZETIA) 10 MG tablet Take 1 tablet by mouth daily.     loperamide (IMODIUM) 2 MG capsule      ondansetron (ZOFRAN) 4 MG tablet Take by mouth.     oxyCODONE-acetaminophen (PERCOCET/ROXICET) 5-325 MG tablet Take by mouth.     rOPINIRole (REQUIP) 2 MG tablet Take by mouth.     Zinc Picolinate POWD Take 1 tablet by mouth daily.     clindamycin-benzoyl peroxide (BENZACLIN) gel clindamycin 1 %-benzoyl peroxide 5 % topical gel with pump     diclofenac (VOLTAREN) 75 MG EC tablet diclofenac sodium 75 mg tablet,delayed release     diclofenac Sodium (VOLTAREN) 1 % GEL Voltaren 1 % topical gel     Estradiol-Norethindrone Acet (ACTIVELLA) 0.5-0.1 MG tablet Take 1 tablet by mouth daily. 90 tablet 3   Omeprazole-Sodium Bicarbonate (ZEGERID OTC PO) Take 1 capsule by mouth daily.       rOPINIRole (REQUIP) 1 MG tablet ropinirole 1 mg tablet     No facility-administered medications prior to visit.      ROS:  Review of Systems  Constitutional:  Negative for fever.  Gastrointestinal:  Negative for blood in stool, constipation, diarrhea, nausea and vomiting.  Genitourinary:  Positive for vaginal bleeding. Negative for  dyspareunia, dysuria, flank pain, frequency, hematuria, urgency, vaginal discharge and vaginal pain.  Musculoskeletal:  Negative for back pain.  Skin:  Negative for rash.   BREAST: No symptoms   OBJECTIVE:   Vitals:  BP 110/80   Ht 5\' 5"  (1.651 m)   Wt 236 lb (107 kg)   LMP 01/26/2012   BMI 39.27 kg/m  Physical Exam Vitals reviewed.  Constitutional:      Appearance: She is well-developed.  Pulmonary:     Effort: Pulmonary effort is normal.  Genitourinary:    General: Normal vulva.     Pubic Area: No rash.      Labia:        Right: No rash, tenderness or lesion.        Left: No rash, tenderness or lesion.      Vagina: Normal. No vaginal discharge, erythema or tenderness.     Cervix: Normal.     Uterus: Normal. Not enlarged and not tender.      Adnexa: Right adnexa normal and left adnexa normal.       Right: No mass or tenderness.         Left: No mass or tenderness.    Musculoskeletal:        General: Normal range of motion.     Cervical back: Normal range of motion.  Skin:    General: Skin is warm and dry.  Neurological:     General: No focal deficit present.     Mental Status: She is alert and oriented to person, place, and time.  Psychiatric:        Mood and Affect: Mood normal.        Behavior: Behavior normal.        Thought Content: Thought content normal.        Judgment: Judgment normal.    Assessment/Plan: PMB (postmenopausal bleeding) - Plan: US PELVIS TRANSVAGINAL NON-OB (TV ONLY), IGP, Aptima HPV; 11 months ago; no sx since which is reassuring. Neg exam. Check pap and GYN u/s. Will f/u with results.   Cervical cancer screening - Plan: IGP, Aptima HPV  Screening for HPV (human papillomavirus) - Plan: IGP, Aptima HPV    Return in about 2 days (around 04/08/2023) for GYn u/s for PMB--[facility] to call pt.  Toma Arts B. Nikia Mangino, PA-C 04/06/2023 2:35 PM

## 2023-04-06 ENCOUNTER — Encounter: Payer: Self-pay | Admitting: Obstetrics and Gynecology

## 2023-04-06 ENCOUNTER — Ambulatory Visit: Payer: BC Managed Care – PPO | Admitting: Obstetrics and Gynecology

## 2023-04-06 VITALS — BP 110/80 | Ht 65.0 in | Wt 236.0 lb

## 2023-04-06 DIAGNOSIS — Z1151 Encounter for screening for human papillomavirus (HPV): Secondary | ICD-10-CM

## 2023-04-06 DIAGNOSIS — Z124 Encounter for screening for malignant neoplasm of cervix: Secondary | ICD-10-CM

## 2023-04-06 DIAGNOSIS — N950 Postmenopausal bleeding: Secondary | ICD-10-CM

## 2023-04-06 NOTE — Patient Instructions (Signed)
I value your feedback and you entrusting us with your care. If you get a [facility] patient survey, I would appreciate you taking the time to let us know about your experience today. Thank you! ? ? ?

## 2023-04-11 LAB — IGP, APTIMA HPV: HPV Aptima: NEGATIVE

## 2023-04-20 ENCOUNTER — Other Ambulatory Visit: Payer: BC Managed Care – PPO

## 2023-05-03 ENCOUNTER — Telehealth: Payer: Self-pay | Admitting: Obstetrics and Gynecology

## 2023-05-03 ENCOUNTER — Ambulatory Visit (INDEPENDENT_AMBULATORY_CARE_PROVIDER_SITE_OTHER): Payer: BC Managed Care – PPO

## 2023-05-03 DIAGNOSIS — N939 Abnormal uterine and vaginal bleeding, unspecified: Secondary | ICD-10-CM

## 2023-05-03 DIAGNOSIS — N950 Postmenopausal bleeding: Secondary | ICD-10-CM

## 2023-05-03 NOTE — Telephone Encounter (Signed)
Pt aware of GYN u/s results. EM=3.16 mm. PMB episode was 8/23. Since almost a year and no sx since, will follow expectantly. U/s showed simple 1.38 cm x 1.14 cm LTO cyst. Will repeat Gyn u/s in 1 yr per RSNA guidelines. Pt to call 7/25 to schedule f/u u/s. F/u sooner prn.

## 2024-04-29 ENCOUNTER — Other Ambulatory Visit: Payer: Self-pay | Admitting: Medical Genetics

## 2024-05-14 ENCOUNTER — Other Ambulatory Visit
Admission: RE | Admit: 2024-05-14 | Discharge: 2024-05-14 | Disposition: A | Discharge status: Home / Self Care | Payer: Self-pay | Source: Ambulatory Visit | Attending: Medical Genetics | Admitting: Medical Genetics

## 2024-05-24 LAB — GENECONNECT MOLECULAR SCREEN: Genetic Analysis Overall Interpretation: NEGATIVE
